# Patient Record
Sex: Female | Born: 1960
Health system: Southern US, Community
[De-identification: ages and names within clinical notes are randomized; demographics above are authoritative.]

## PROBLEM LIST (undated history)

## (undated) DIAGNOSIS — M797 Fibromyalgia: Secondary | ICD-10-CM

## (undated) DIAGNOSIS — M5030 Other cervical disc degeneration, unspecified cervical region: Secondary | ICD-10-CM

## (undated) HISTORY — DX: Other cervical disc degeneration, unspecified cervical region: M50.30

## (undated) HISTORY — DX: Fibromyalgia: M79.7

---

## 1984-12-02 HISTORY — PX: OTHER SURGICAL HISTORY: SHX169

## 1996-12-02 HISTORY — PX: CYST REMOVAL HAND: SHX6279

## 2015-01-31 HISTORY — PX: ABDOMINAL HYSTERECTOMY: SHX81

## 2018-07-15 ENCOUNTER — Other Ambulatory Visit (INDEPENDENT_AMBULATORY_CARE_PROVIDER_SITE_OTHER): Payer: Self-pay | Admitting: Radiology

## 2018-07-15 ENCOUNTER — Encounter (INDEPENDENT_AMBULATORY_CARE_PROVIDER_SITE_OTHER): Payer: Self-pay | Admitting: Orthopaedic Surgery

## 2018-07-15 ENCOUNTER — Ambulatory Visit (INDEPENDENT_AMBULATORY_CARE_PROVIDER_SITE_OTHER): Payer: Self-pay

## 2018-07-15 ENCOUNTER — Ambulatory Visit (INDEPENDENT_AMBULATORY_CARE_PROVIDER_SITE_OTHER): Payer: BLUE CROSS/BLUE SHIELD | Admitting: Orthopaedic Surgery

## 2018-07-15 VITALS — BP 131/76 | HR 59 | Ht 65.0 in | Wt 159.0 lb

## 2018-07-15 DIAGNOSIS — M542 Cervicalgia: Secondary | ICD-10-CM

## 2018-07-15 DIAGNOSIS — M25551 Pain in right hip: Secondary | ICD-10-CM

## 2018-07-15 DIAGNOSIS — M25511 Pain in right shoulder: Secondary | ICD-10-CM

## 2018-07-15 DIAGNOSIS — G8929 Other chronic pain: Secondary | ICD-10-CM | POA: Diagnosis not present
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
## 2018-08-02 ENCOUNTER — Ambulatory Visit
Admission: RE | Admit: 2018-08-02 | Discharge: 2018-08-02 | Disposition: A | Discharge status: Home / Self Care | Payer: BLUE CROSS/BLUE SHIELD | Source: Ambulatory Visit | Attending: Orthopaedic Surgery | Admitting: Orthopaedic Surgery

## 2018-08-02 DIAGNOSIS — M25551 Pain in right hip: Secondary | ICD-10-CM

## 2018-08-05 ENCOUNTER — Ambulatory Visit (INDEPENDENT_AMBULATORY_CARE_PROVIDER_SITE_OTHER): Payer: BLUE CROSS/BLUE SHIELD | Admitting: Orthopaedic Surgery

## 2019-06-21 ENCOUNTER — Other Ambulatory Visit: Payer: BLUE CROSS/BLUE SHIELD

## 2019-06-21 DIAGNOSIS — Z20822 Contact with and (suspected) exposure to covid-19: Secondary | ICD-10-CM

## 2019-06-24 LAB — NOVEL CORONAVIRUS, NAA: SARS-CoV-2, NAA: NOT DETECTED

## 2019-06-28 ENCOUNTER — Telehealth: Payer: Self-pay | Admitting: General Practice

## 2019-06-28 NOTE — Telephone Encounter (Signed)
Patient informed of COVID results °

## 2019-10-11 ENCOUNTER — Other Ambulatory Visit (HOSPITAL_COMMUNITY): Payer: Self-pay | Admitting: Anesthesiology

## 2019-10-11 ENCOUNTER — Other Ambulatory Visit: Payer: Self-pay | Admitting: Anesthesiology

## 2019-10-11 DIAGNOSIS — G90511 Complex regional pain syndrome I of right upper limb: Secondary | ICD-10-CM

## 2019-10-21 ENCOUNTER — Encounter (HOSPITAL_COMMUNITY)
Admission: RE | Admit: 2019-10-21 | Discharge: 2019-10-21 | Disposition: A | Discharge status: Home / Self Care | Payer: Worker's Compensation | Source: Ambulatory Visit | Attending: Anesthesiology | Admitting: Anesthesiology

## 2019-10-21 ENCOUNTER — Other Ambulatory Visit: Payer: Self-pay

## 2019-10-21 DIAGNOSIS — G90511 Complex regional pain syndrome I of right upper limb: Secondary | ICD-10-CM

## 2019-10-21 MED ORDER — TECHNETIUM TC 99M MEDRONATE IV KIT
20.8000 | PACK | Freq: Once | INTRAVENOUS | Status: AC
  Administered 2019-10-21: 20.8 via INTRAVENOUS

## 2019-10-22 ENCOUNTER — Encounter (HOSPITAL_COMMUNITY): Payer: Managed Care, Other (non HMO)

## 2019-10-22 ENCOUNTER — Encounter (HOSPITAL_COMMUNITY): Admission: RE | Admit: 2019-10-22 | Payer: Managed Care, Other (non HMO) | Source: Ambulatory Visit

## 2021-07-04 IMAGING — NM NM BONE 3 PHASE
2 series · 12 of 12 positions shown · non-contrast
Comparison: None

Radiographic correlation: None

CLINICAL DATA: Complex regional pain syndrome type I affecting
RIGHT upper arm

EXAM:
NUCLEAR MEDICINE 3-PHASE BONE SCAN
TECHNIQUE: Radionuclide angiographic images, immediate static blood pool
images, and 3-hour delayed static images were obtained of the upper
extremities and chest after intravenous injection of
radiopharmaceutical.
RADIOPHARMACEUTICALS:  20.8 mCi Wc-AAm MDP IV

[Series 1: flow · 4.14mm/px · 6 of 40 frames shown (1 of 2)]
[frame 4/40]
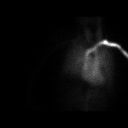
[frame 10/40]
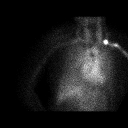
[frame 17/40]
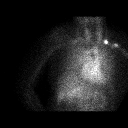
[frame 24/40]
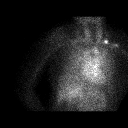
[frame 30/40]
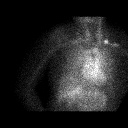
[frame 37/40]
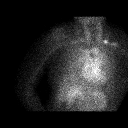

[Series 1: flow · 4.14mm/px · 6 of 40 frames shown (2 of 2)]
[frame 4/40]
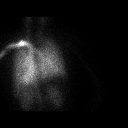
[frame 10/40]
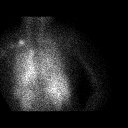
[frame 17/40]
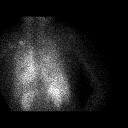
[frame 24/40]
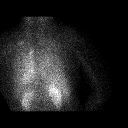
[frame 30/40]
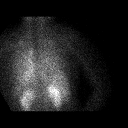
[frame 37/40]
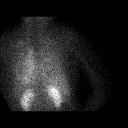

[12 of 12 positions shown; findings below may reference images not displayed]

FINDINGS: Vascular phase: Suboptimal assessment of blood flow to the upper
extremities

Blood pool phase: Increased blood pool at the periarticular regions
of the RIGHT shoulder. Normal blood pool at LEFT shoulder.

Delayed phase: Delayed abnormal increased tracer uptake identified
at the RIGHT shoulder region including the AC joint, glenohumeral
joint and humeral head. Additionally, increased uptake is seen at
the RIGHT elbow and diffusely throughout the RIGHT hand with
accentuation of uptake in the periarticular regions of the RIGHT
hand joints. Findings are consistent with hyperemia to the RIGHT
upper extremity and consistent with complex regional pain syndrome.
No additional abnormal uptake of tracer seen within the chest or
LEFT upper extremity.
IMPRESSION: Increased blood pool of tracer surrounding the RIGHT shoulder joint
region consistent with hyperemia.

Abnormal delayed uptake of tracer throughout the RIGHT upper
extremity, including at multiple small joints in the RIGHT hand, a
pattern consistent with complex regional pain syndrome.

## 2023-06-09 DIAGNOSIS — H6123 Impacted cerumen, bilateral: Secondary | ICD-10-CM | POA: Diagnosis not present

## 2023-09-10 DIAGNOSIS — Z9102 Food additives allergy status: Secondary | ICD-10-CM | POA: Diagnosis not present

## 2023-09-10 DIAGNOSIS — E785 Hyperlipidemia, unspecified: Secondary | ICD-10-CM | POA: Diagnosis not present

## 2023-09-10 DIAGNOSIS — Z809 Family history of malignant neoplasm, unspecified: Secondary | ICD-10-CM | POA: Diagnosis not present

## 2023-09-10 DIAGNOSIS — G629 Polyneuropathy, unspecified: Secondary | ICD-10-CM | POA: Diagnosis not present

## 2023-09-10 DIAGNOSIS — L309 Dermatitis, unspecified: Secondary | ICD-10-CM | POA: Diagnosis not present

## 2023-09-10 DIAGNOSIS — Z87892 Personal history of anaphylaxis: Secondary | ICD-10-CM | POA: Diagnosis not present

## 2023-09-10 DIAGNOSIS — J45909 Unspecified asthma, uncomplicated: Secondary | ICD-10-CM | POA: Diagnosis not present

## 2023-09-10 DIAGNOSIS — M1990 Unspecified osteoarthritis, unspecified site: Secondary | ICD-10-CM | POA: Diagnosis not present

## 2023-09-10 DIAGNOSIS — I10 Essential (primary) hypertension: Secondary | ICD-10-CM | POA: Diagnosis not present

## 2023-09-10 DIAGNOSIS — Z91013 Allergy to seafood: Secondary | ICD-10-CM | POA: Diagnosis not present

## 2023-09-10 DIAGNOSIS — Z008 Encounter for other general examination: Secondary | ICD-10-CM | POA: Diagnosis not present

## 2023-09-10 DIAGNOSIS — L409 Psoriasis, unspecified: Secondary | ICD-10-CM | POA: Diagnosis not present

## 2023-10-07 DIAGNOSIS — Z23 Encounter for immunization: Secondary | ICD-10-CM | POA: Diagnosis not present

## 2023-10-07 DIAGNOSIS — M545 Low back pain, unspecified: Secondary | ICD-10-CM | POA: Diagnosis not present

## 2023-10-07 DIAGNOSIS — M5410 Radiculopathy, site unspecified: Secondary | ICD-10-CM | POA: Diagnosis not present

## 2023-10-07 DIAGNOSIS — M797 Fibromyalgia: Secondary | ICD-10-CM | POA: Diagnosis not present

## 2023-10-07 DIAGNOSIS — G9050 Complex regional pain syndrome I, unspecified: Secondary | ICD-10-CM | POA: Diagnosis not present

## 2023-11-03 DIAGNOSIS — M545 Low back pain, unspecified: Secondary | ICD-10-CM | POA: Diagnosis not present

## 2023-11-03 DIAGNOSIS — M25551 Pain in right hip: Secondary | ICD-10-CM | POA: Diagnosis not present

## 2023-11-10 DIAGNOSIS — M25551 Pain in right hip: Secondary | ICD-10-CM | POA: Diagnosis not present

## 2023-11-10 DIAGNOSIS — M545 Low back pain, unspecified: Secondary | ICD-10-CM | POA: Diagnosis not present

## 2023-11-17 DIAGNOSIS — M25551 Pain in right hip: Secondary | ICD-10-CM | POA: Diagnosis not present

## 2023-11-17 DIAGNOSIS — M545 Low back pain, unspecified: Secondary | ICD-10-CM | POA: Diagnosis not present

## 2023-11-20 DIAGNOSIS — J45909 Unspecified asthma, uncomplicated: Secondary | ICD-10-CM | POA: Diagnosis not present

## 2023-11-20 DIAGNOSIS — M5431 Sciatica, right side: Secondary | ICD-10-CM | POA: Diagnosis not present

## 2023-11-20 DIAGNOSIS — M5030 Other cervical disc degeneration, unspecified cervical region: Secondary | ICD-10-CM | POA: Diagnosis not present

## 2023-11-20 DIAGNOSIS — E785 Hyperlipidemia, unspecified: Secondary | ICD-10-CM | POA: Diagnosis not present

## 2023-11-20 DIAGNOSIS — F322 Major depressive disorder, single episode, severe without psychotic features: Secondary | ICD-10-CM | POA: Diagnosis not present

## 2023-11-20 DIAGNOSIS — Z0000 Encounter for general adult medical examination without abnormal findings: Secondary | ICD-10-CM | POA: Diagnosis not present

## 2023-11-20 DIAGNOSIS — H52223 Regular astigmatism, bilateral: Secondary | ICD-10-CM | POA: Diagnosis not present

## 2023-11-20 DIAGNOSIS — M797 Fibromyalgia: Secondary | ICD-10-CM | POA: Diagnosis not present

## 2023-11-20 DIAGNOSIS — G8929 Other chronic pain: Secondary | ICD-10-CM | POA: Diagnosis not present

## 2023-11-20 DIAGNOSIS — I83899 Varicose veins of unspecified lower extremities with other complications: Secondary | ICD-10-CM | POA: Diagnosis not present

## 2023-11-20 DIAGNOSIS — L409 Psoriasis, unspecified: Secondary | ICD-10-CM | POA: Diagnosis not present

## 2023-11-20 DIAGNOSIS — D649 Anemia, unspecified: Secondary | ICD-10-CM | POA: Diagnosis not present

## 2023-11-20 DIAGNOSIS — G90511 Complex regional pain syndrome I of right upper limb: Secondary | ICD-10-CM | POA: Diagnosis not present

## 2023-12-01 DIAGNOSIS — M25551 Pain in right hip: Secondary | ICD-10-CM | POA: Diagnosis not present

## 2023-12-01 DIAGNOSIS — M545 Low back pain, unspecified: Secondary | ICD-10-CM | POA: Diagnosis not present

## 2023-12-08 DIAGNOSIS — M25551 Pain in right hip: Secondary | ICD-10-CM | POA: Diagnosis not present

## 2023-12-08 DIAGNOSIS — M545 Low back pain, unspecified: Secondary | ICD-10-CM | POA: Diagnosis not present

## 2023-12-15 DIAGNOSIS — M545 Low back pain, unspecified: Secondary | ICD-10-CM | POA: Diagnosis not present

## 2023-12-15 DIAGNOSIS — M25551 Pain in right hip: Secondary | ICD-10-CM | POA: Diagnosis not present

## 2023-12-26 DIAGNOSIS — M545 Low back pain, unspecified: Secondary | ICD-10-CM | POA: Diagnosis not present

## 2023-12-26 DIAGNOSIS — M25551 Pain in right hip: Secondary | ICD-10-CM | POA: Diagnosis not present

## 2023-12-29 DIAGNOSIS — M25551 Pain in right hip: Secondary | ICD-10-CM | POA: Diagnosis not present

## 2023-12-29 DIAGNOSIS — M545 Low back pain, unspecified: Secondary | ICD-10-CM | POA: Diagnosis not present

## 2024-01-23 DIAGNOSIS — M549 Dorsalgia, unspecified: Secondary | ICD-10-CM | POA: Diagnosis not present

## 2024-01-23 DIAGNOSIS — G90511 Complex regional pain syndrome I of right upper limb: Secondary | ICD-10-CM | POA: Diagnosis not present

## 2024-01-23 DIAGNOSIS — M797 Fibromyalgia: Secondary | ICD-10-CM | POA: Diagnosis not present

## 2024-01-30 DIAGNOSIS — M25551 Pain in right hip: Secondary | ICD-10-CM | POA: Diagnosis not present

## 2024-01-30 DIAGNOSIS — M545 Low back pain, unspecified: Secondary | ICD-10-CM | POA: Diagnosis not present

## 2024-02-03 DIAGNOSIS — M545 Low back pain, unspecified: Secondary | ICD-10-CM | POA: Diagnosis not present

## 2024-02-13 DIAGNOSIS — M545 Low back pain, unspecified: Secondary | ICD-10-CM | POA: Diagnosis not present

## 2024-02-16 DIAGNOSIS — M545 Low back pain, unspecified: Secondary | ICD-10-CM | POA: Diagnosis not present

## 2024-03-16 DIAGNOSIS — Z1231 Encounter for screening mammogram for malignant neoplasm of breast: Secondary | ICD-10-CM | POA: Diagnosis not present

## 2024-05-06 DIAGNOSIS — Z87892 Personal history of anaphylaxis: Secondary | ICD-10-CM | POA: Diagnosis not present

## 2024-05-06 DIAGNOSIS — L409 Psoriasis, unspecified: Secondary | ICD-10-CM | POA: Diagnosis not present

## 2024-05-06 DIAGNOSIS — G9050 Complex regional pain syndrome I, unspecified: Secondary | ICD-10-CM | POA: Diagnosis not present

## 2024-05-06 DIAGNOSIS — I10 Essential (primary) hypertension: Secondary | ICD-10-CM | POA: Diagnosis not present

## 2024-05-06 DIAGNOSIS — F321 Major depressive disorder, single episode, moderate: Secondary | ICD-10-CM | POA: Diagnosis not present

## 2024-05-06 DIAGNOSIS — M1990 Unspecified osteoarthritis, unspecified site: Secondary | ICD-10-CM | POA: Diagnosis not present

## 2024-05-06 DIAGNOSIS — Z809 Family history of malignant neoplasm, unspecified: Secondary | ICD-10-CM | POA: Diagnosis not present

## 2024-05-06 DIAGNOSIS — L309 Dermatitis, unspecified: Secondary | ICD-10-CM | POA: Diagnosis not present

## 2024-05-06 DIAGNOSIS — M5440 Lumbago with sciatica, unspecified side: Secondary | ICD-10-CM | POA: Diagnosis not present

## 2024-05-06 DIAGNOSIS — Z888 Allergy status to other drugs, medicaments and biological substances status: Secondary | ICD-10-CM | POA: Diagnosis not present

## 2024-05-06 DIAGNOSIS — G629 Polyneuropathy, unspecified: Secondary | ICD-10-CM | POA: Diagnosis not present

## 2024-05-06 DIAGNOSIS — E785 Hyperlipidemia, unspecified: Secondary | ICD-10-CM | POA: Diagnosis not present

## 2024-05-06 DIAGNOSIS — Z008 Encounter for other general examination: Secondary | ICD-10-CM | POA: Diagnosis not present

## 2024-05-20 DIAGNOSIS — M2550 Pain in unspecified joint: Secondary | ICD-10-CM | POA: Diagnosis not present

## 2024-05-20 DIAGNOSIS — E785 Hyperlipidemia, unspecified: Secondary | ICD-10-CM | POA: Diagnosis not present

## 2024-05-20 DIAGNOSIS — R635 Abnormal weight gain: Secondary | ICD-10-CM | POA: Diagnosis not present

## 2024-05-20 DIAGNOSIS — M797 Fibromyalgia: Secondary | ICD-10-CM | POA: Diagnosis not present

## 2024-06-02 ENCOUNTER — Ambulatory Visit: Admitting: Podiatry

## 2024-06-02 ENCOUNTER — Ambulatory Visit (INDEPENDENT_AMBULATORY_CARE_PROVIDER_SITE_OTHER)

## 2024-06-02 ENCOUNTER — Encounter: Payer: Self-pay | Admitting: Podiatry

## 2024-06-02 VITALS — Ht 65.0 in | Wt 159.0 lb

## 2024-06-02 DIAGNOSIS — M722 Plantar fascial fibromatosis: Secondary | ICD-10-CM | POA: Diagnosis not present

## 2024-06-02 DIAGNOSIS — M7751 Other enthesopathy of right foot: Secondary | ICD-10-CM | POA: Diagnosis not present

## 2024-06-02 NOTE — Progress Notes (Signed)
   Chief Complaint  Patient presents with   Foot Pain    Pt is here due to right foot pain states she has a bump on the bottom of the foot that has been there for a few weeks ago states the area is very tender, has been having heel pain for a few weeks also, concern with 2nd and 3rd toes are numb.    HPI: 63 y.o. female presenting today for evaluation of pain and tenderness to the plantar aspect of the right foot.  Onset about 2-3 weeks ago.  She says that the pain began when she actually purchased a pair of on cloud shoes and began wearing them.  No history of injury.  Past Medical History:  Diagnosis Date   DDD (degenerative disc disease), cervical    Fibromyalgia     Past Surgical History:  Procedure Laterality Date   ABDOMINAL HYSTERECTOMY  01/2015   C SECTON  1986   CYST REMOVAL HAND  1998    Allergies  Allergen Reactions   Cymbalta [Duloxetine Hcl]    Excedrin Extra Strength [Asa-Apap-Caff Buffered]      Physical Exam: General: The patient is alert and oriented x3 in no acute distress.  Dermatology: Skin is warm, dry and supple bilateral lower extremities.   Vascular: Palpable pedal pulses bilaterally. Capillary refill within normal limits.  No appreciable edema.  No erythema.  Neurological: Grossly intact via light touch  Musculoskeletal Exam: Soft tissue mass noted along the plantar medial arch of the right foot just proximal to the first MTP consistent with a plantar fibroma.  Associated tenderness  Radiographic Exam RT foot 06/02/2024:  Normal osseous mineralization. Joint spaces preserved.  No fractures or osseous irregularities noted.  Impression: Negative  Assessment/Plan of Care: 1.  Plantar fibroma right foot  -Patient evaluated.  X-rays reviewed -Explained the pathology and etiology of plantar fibromas.  I suspect that the new pair shoes that she began wearing about 2 weeks ago exacerbated or irritated the plantar fibroma and she is experiencing pain.  She  actually changed shoes about 1 week ago and the foot is beginning to feel better -Recommend shoes that do not irritate the plantar fibroma -Prescription for meloxicam 15 mg daily PRN -Return to clinic PRN       Thresa EMERSON Sar, DPM [facility]  Dr. Thresa EMERSON Sar, DPM    [address] [place], [place] [address]                Office [phone]  Fax [phone]

## 2024-11-22 DIAGNOSIS — D649 Anemia, unspecified: Secondary | ICD-10-CM | POA: Diagnosis not present

## 2024-11-22 DIAGNOSIS — Z1159 Encounter for screening for other viral diseases: Secondary | ICD-10-CM | POA: Diagnosis not present

## 2024-11-22 DIAGNOSIS — E785 Hyperlipidemia, unspecified: Secondary | ICD-10-CM | POA: Diagnosis not present
